# Patient Record
Sex: Male | Born: 1985 | Race: Black or African American | Hispanic: No | Marital: Single
Health system: Southern US, Community
[De-identification: ages and names within clinical notes are randomized; demographics above are authoritative.]

---

## 2005-03-07 ENCOUNTER — Emergency Department (HOSPITAL_COMMUNITY): Admission: EM | Admit: 2005-03-07 | Discharge: 2005-03-07 | Payer: Self-pay | Admitting: Family Medicine

## 2007-01-01 ENCOUNTER — Emergency Department (HOSPITAL_COMMUNITY): Admission: EM | Admit: 2007-01-01 | Discharge: 2007-01-01 | Payer: Self-pay | Admitting: Family Medicine

## 2007-01-28 ENCOUNTER — Emergency Department (HOSPITAL_COMMUNITY): Admission: EM | Admit: 2007-01-28 | Discharge: 2007-01-28 | Payer: Self-pay | Admitting: Family Medicine

## 2007-02-23 ENCOUNTER — Emergency Department (HOSPITAL_COMMUNITY): Admission: EM | Admit: 2007-02-23 | Discharge: 2007-02-23 | Payer: Self-pay | Admitting: Emergency Medicine

## 2008-11-27 IMAGING — CR DG CERVICAL SPINE COMPLETE 4+V
5 series · 5 of 5 positions shown · non-contrast
Comparison: none

CLINICAL DATA: 21-year-old, neck pain.  
 CERVICAL SPINE ? 5 VIEW:

[w c-spine lat]
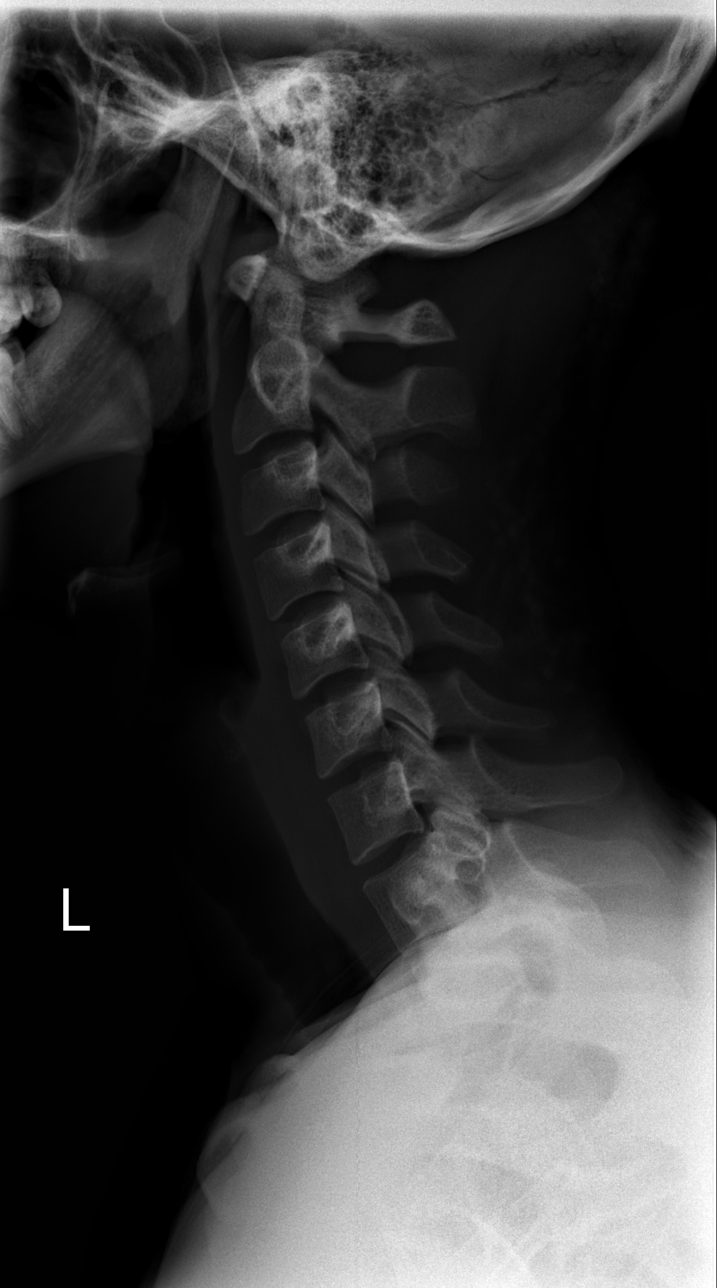

[w c-spine oblique (1 of 2)]
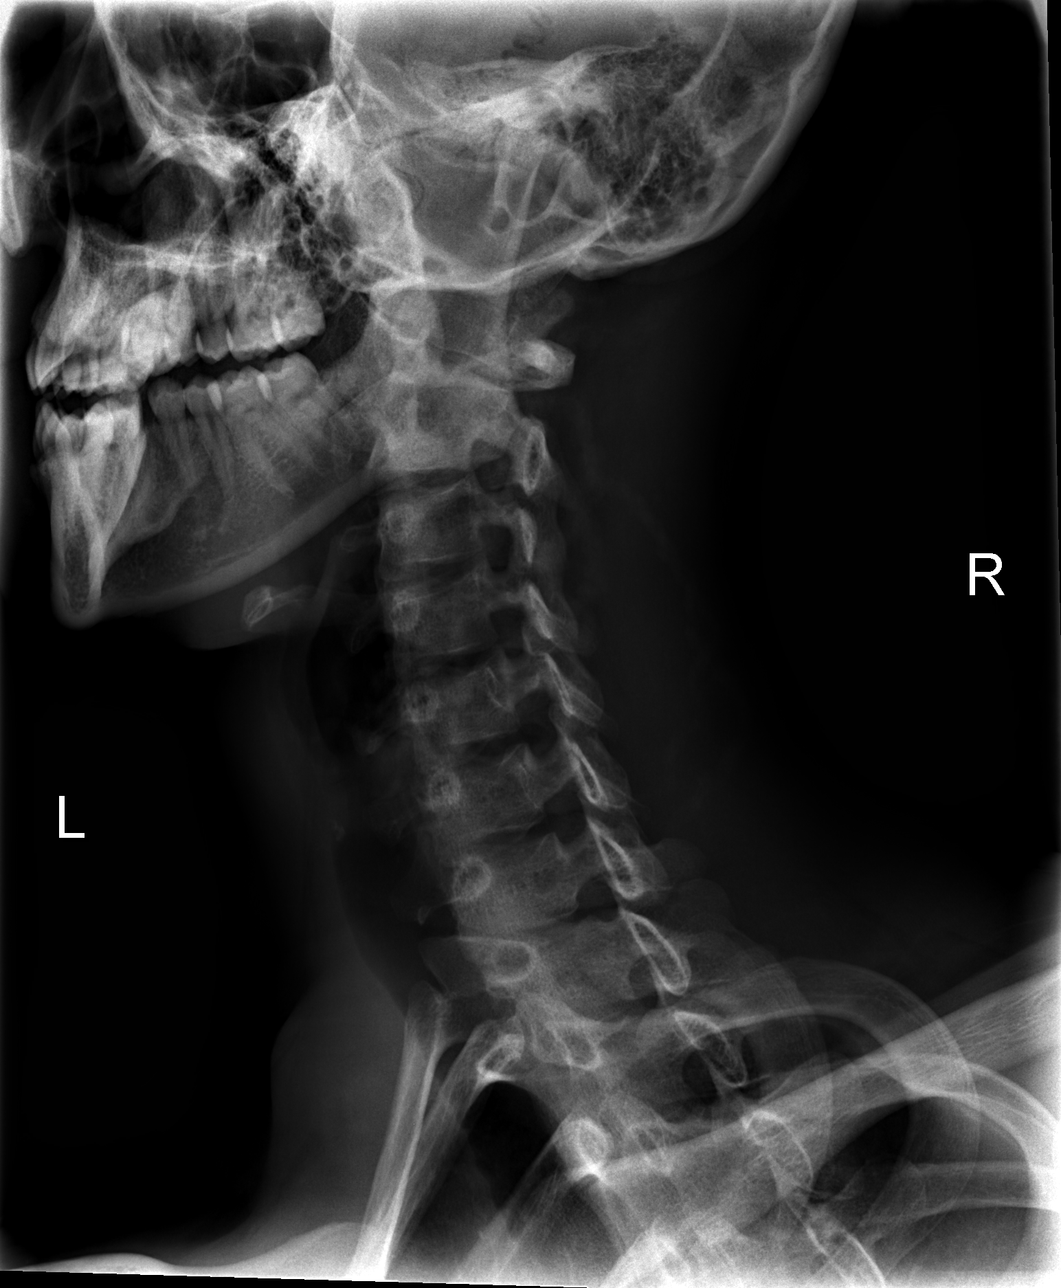

[w c-spine oblique (2 of 2)]
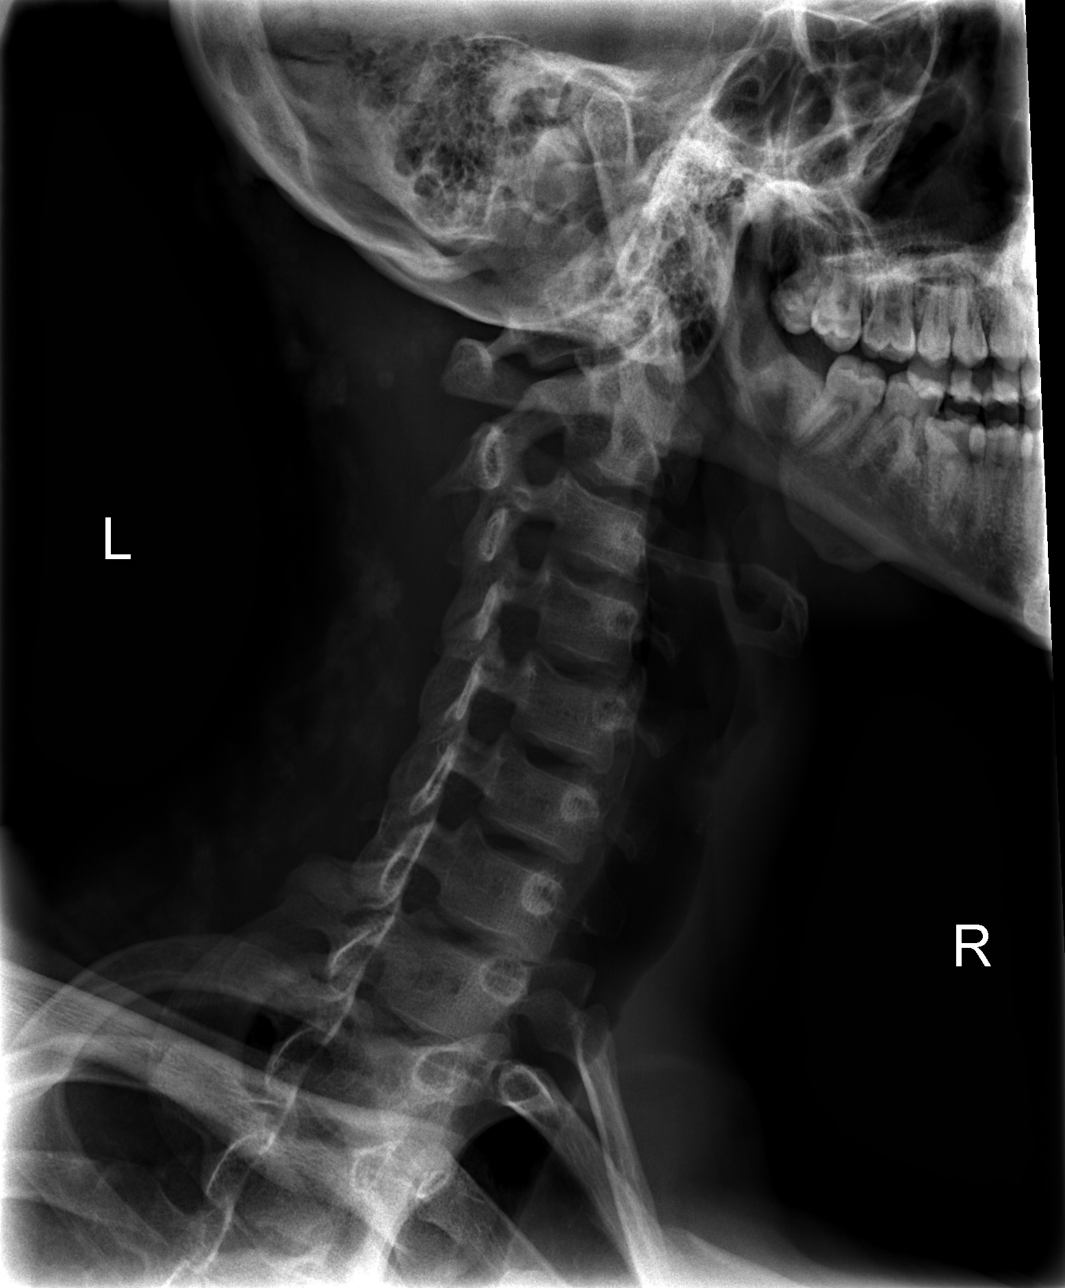

[w c-spine a.p.]
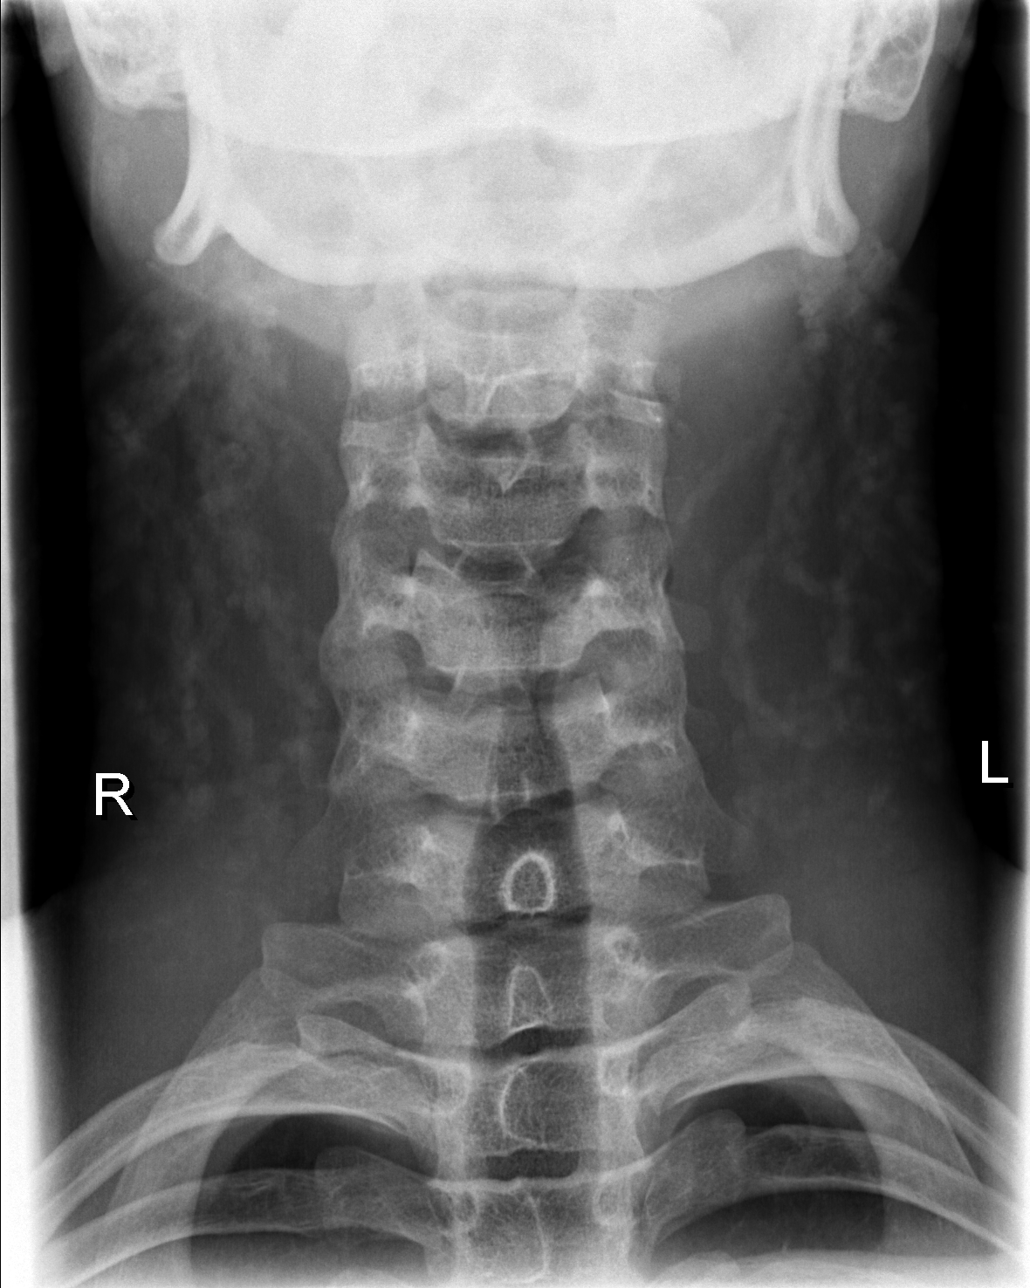

[w c-spine odontoid]
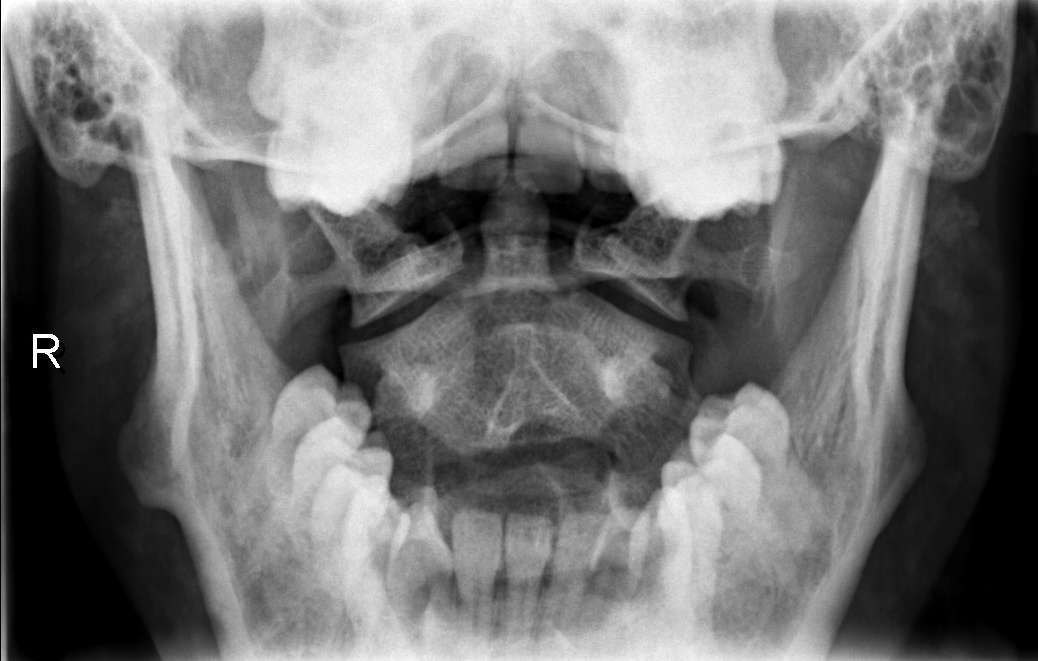

[5 of 5 positions shown; findings below may reference images not displayed]

FINDINGS: There is no evidence of cervical spine fracture or prevertebral soft tissue swelling.  Alignment is normal.  No other significant bone abnormalities are identified.
IMPRESSION: Negative cervical spine radiographs.

## 2008-11-27 IMAGING — CT CT HEAD W/O CM
1 series · 16 of 30 positions shown, 20 images · IV contrast (agent unspecified)
Comparison: none

CLINICAL DATA: Dizziness, headache.
 HEAD CT WITHOUT CONTRAST:
TECHNIQUE: Contiguous axial images were obtained from the base of the skull through the vertex according to standard protocol without contrast.

[Series 2: brain · axial · 0.49mm/px · z∈[+124,+266]mm · 16 of 32 slices shown, 20 images]
[im 2/32  brain]
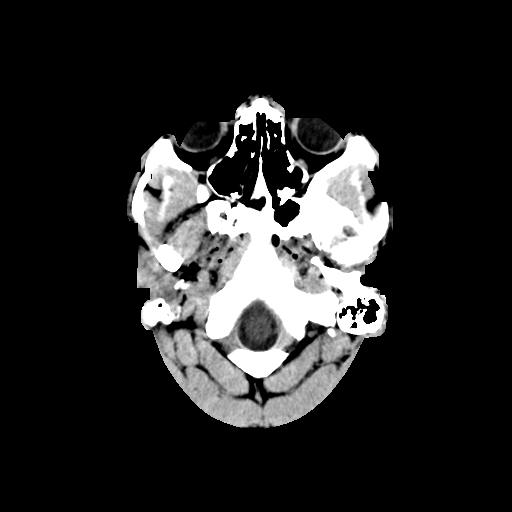
[im 2/32  bone]
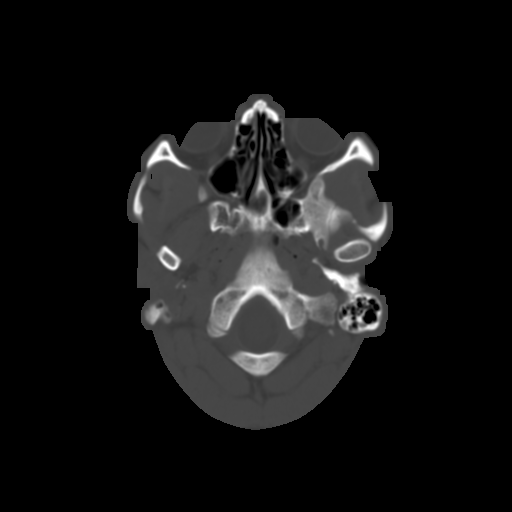
[im 4/32  brain]
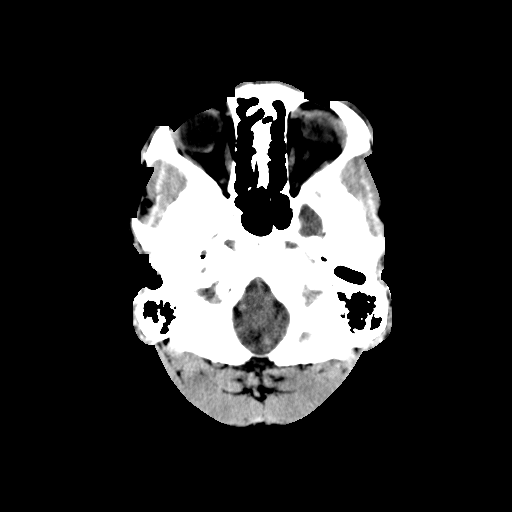
[im 6/32  brain]
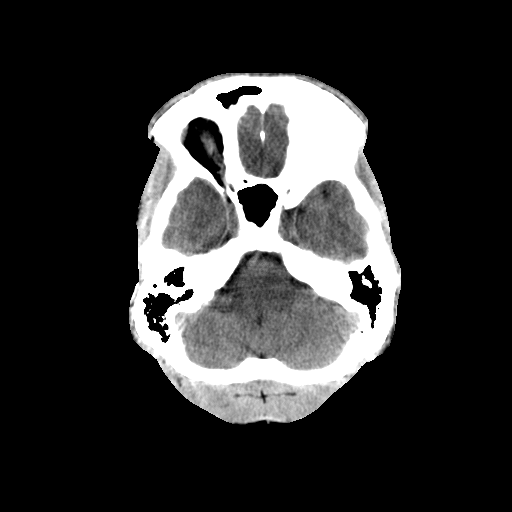
[im 8/32  brain]
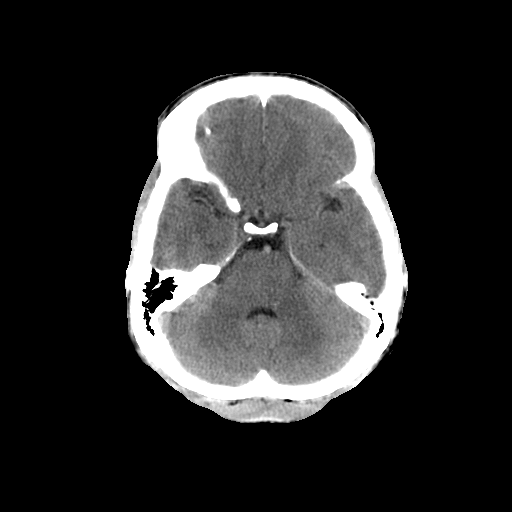
[im 9/32  brain]
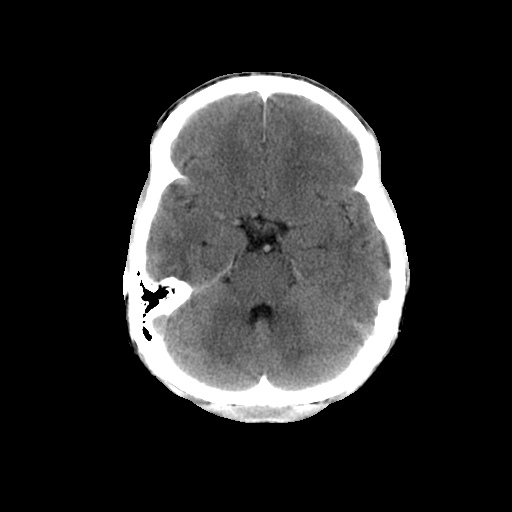
[im 9/32  bone]
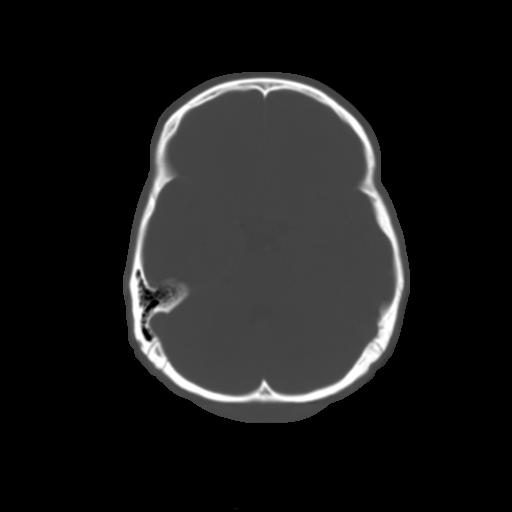
[im 11/32  brain]
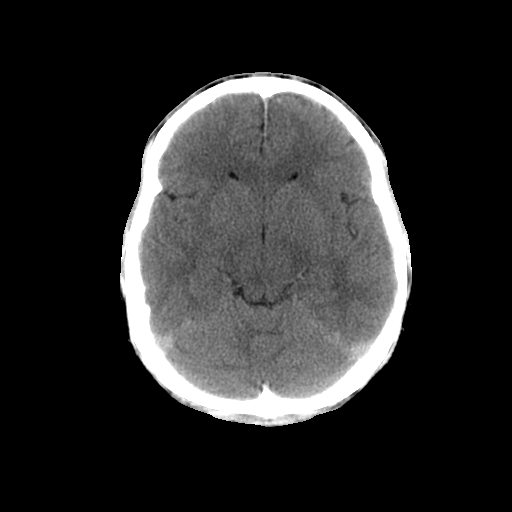
[im 13/32  brain]
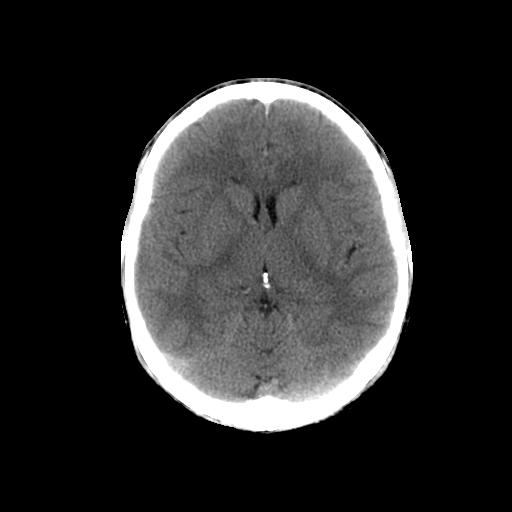
[im 15/32  brain]
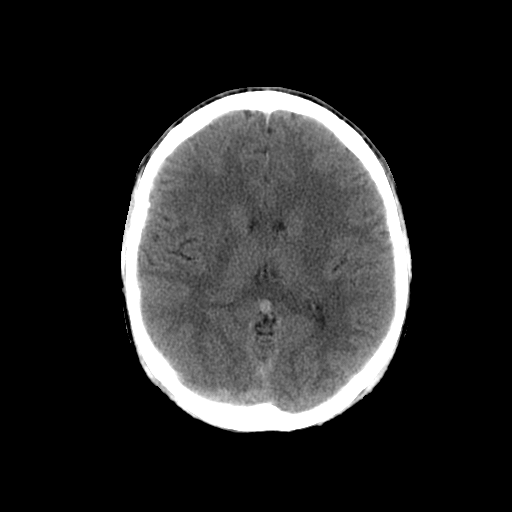
[im 17/32  brain]
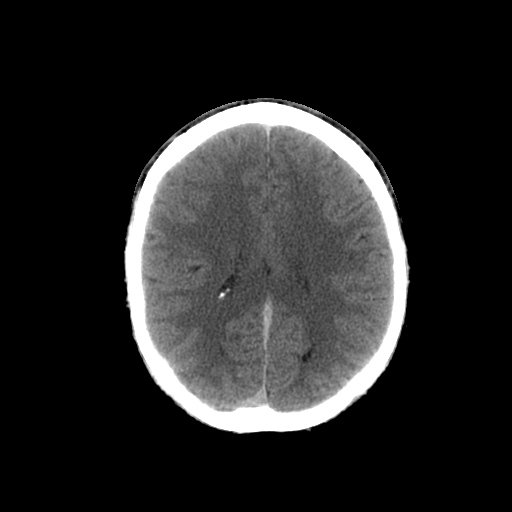
[im 17/32  bone]
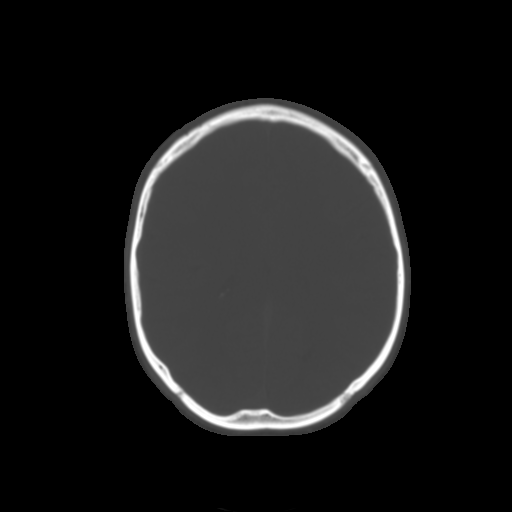
[im 19/32  brain]
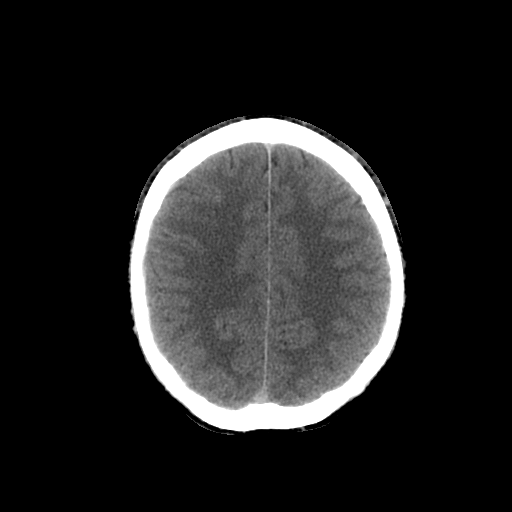
[im 21/32  brain]
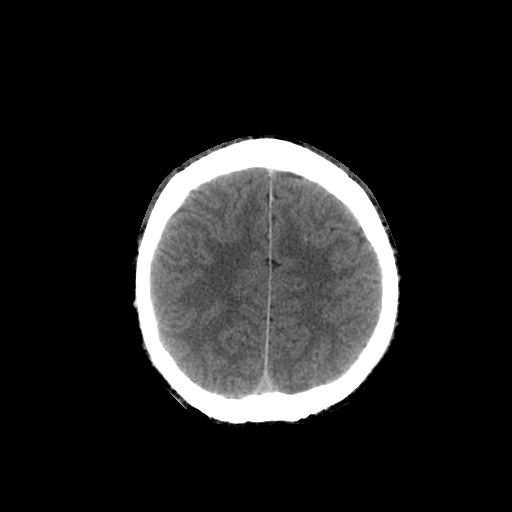
[im 23/32  brain]
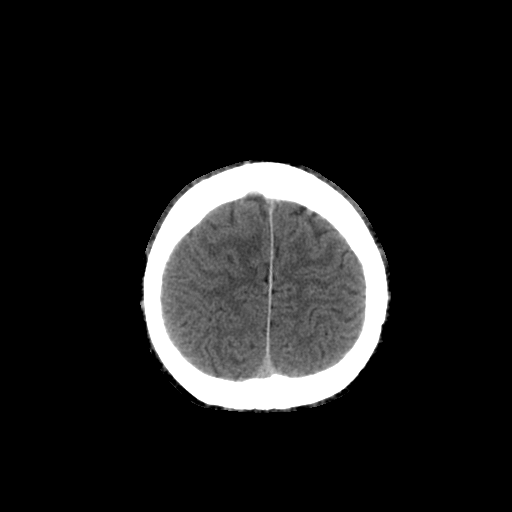
[im 24/32  brain]
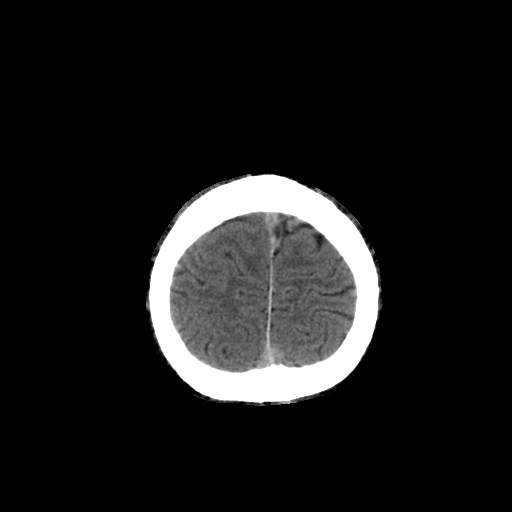
[im 24/32  bone]
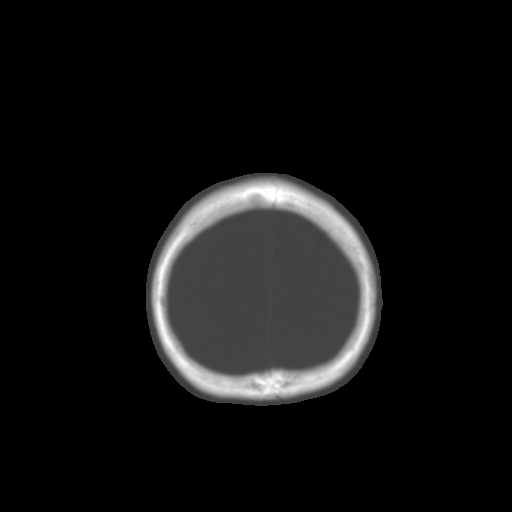
[im 26/32  brain]
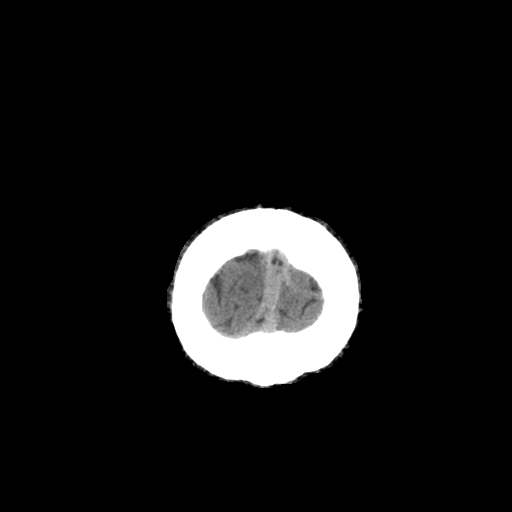
[im 28/32  brain]
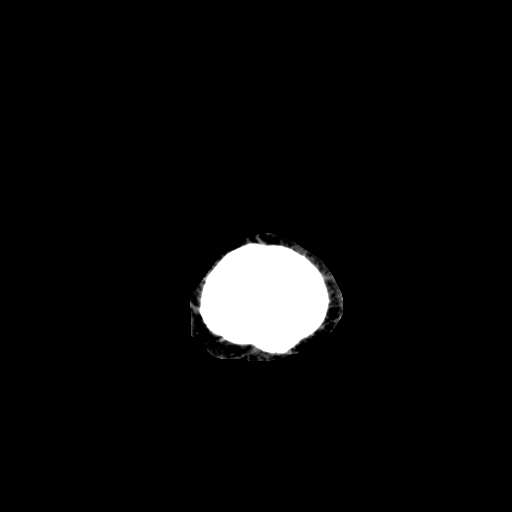
[im 30/32  brain]
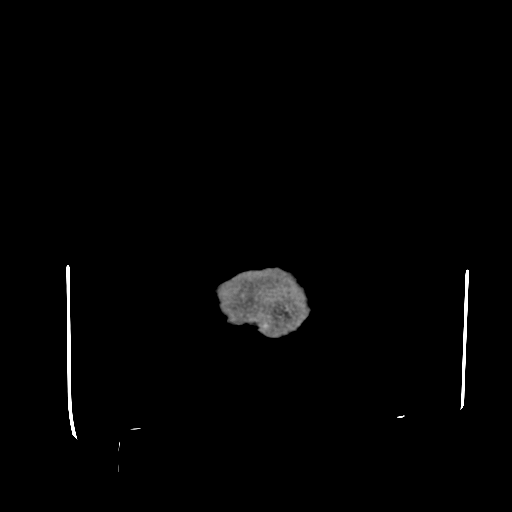

[16 of 30 positions shown; findings below may reference images not displayed]

FINDINGS: There is no evidence of acute intracranial abnormality, including mass or mass effect, hydrocephalus, extra-axial fluid collection, midline shift, hemorrhage, or infarct.  Visualized bony calvarium and paranasal sinuses are unremarkable.
IMPRESSION: No evidence of acute intracranial abnormality.

## 2015-09-26 ENCOUNTER — Encounter (HOSPITAL_COMMUNITY): Payer: Self-pay | Admitting: *Deleted

## 2015-09-26 ENCOUNTER — Emergency Department (HOSPITAL_COMMUNITY)
Admission: EM | Admit: 2015-09-26 | Discharge: 2015-09-26 | Disposition: A | Discharge status: Home / Self Care | Payer: Self-pay | Attending: Emergency Medicine | Admitting: Emergency Medicine

## 2015-09-26 DIAGNOSIS — L259 Unspecified contact dermatitis, unspecified cause: Secondary | ICD-10-CM | POA: Insufficient documentation

## 2015-09-26 DIAGNOSIS — F17200 Nicotine dependence, unspecified, uncomplicated: Secondary | ICD-10-CM | POA: Insufficient documentation

## 2015-09-26 MED ORDER — FAMOTIDINE 20 MG PO TABS
20.0000 mg | ORAL_TABLET | Freq: Once | ORAL | Status: AC
Start: 2015-09-26 — End: 2015-09-26
  Administered 2015-09-26: 20 mg via ORAL
  Filled 2015-09-26: qty 1

## 2015-09-26 MED ORDER — HYDROXYZINE HCL 25 MG PO TABS
25.0000 mg | ORAL_TABLET | Freq: Once | ORAL | Status: AC
  Administered 2015-09-26: 25 mg via ORAL
  Filled 2015-09-26: qty 1

## 2015-09-26 MED ORDER — PREDNISONE 10 MG PO TABS: 20.0000 mg | ORAL_TABLET | Freq: Two times a day (BID) | ORAL | Status: AC

## 2015-09-26 MED ORDER — HYDROXYZINE HCL 25 MG PO TABS: 25.0000 mg | ORAL_TABLET | Freq: Four times a day (QID) | ORAL | Status: AC

## 2015-09-26 MED ORDER — PREDNISONE 20 MG PO TABS
40.0000 mg | ORAL_TABLET | Freq: Once | ORAL | Status: AC
Start: 2015-09-26 — End: 2015-09-26
  Administered 2015-09-26: 40 mg via ORAL
  Filled 2015-09-26: qty 2

## 2015-09-26 NOTE — ED Provider Notes (Signed)
History  By signing my name below, I, Earmon PhoenixJennifer Waddell, attest that this documentation has been prepared under the direction and in the presence of [facility] Neese, [place]. Electronically Signed: Earmon PhoenixJennifer Waddell, ED Scribe. 09/26/2015. 7:35 PM.  Chief Complaint  Patient presents with  . Rash  . Poison Lajoyce [place]ersvy   The history is provided by the patient and medical records. No language interpreter was used.    HPI Comments:  Delorise Jacksonlfred Royse is a 30 y.o. male brought in by EMS who presents to the Emergency Department complaining of a worsening rash located to bilateral arms, chest and back that began 2 days ago. Pt reports he was drinking beer, became intoxicated and was laid in a bed of poison ivy. He has not taken anything to treat his symptoms but was given Benadryl 50 mg via EMS. He denies modifying factors. He denies fever, chills, difficulty breathing or swallowing.  History reviewed. No pertinent past medical history. History reviewed. No pertinent past surgical history. No family history on file. Social History  Substance Use Topics  . Smoking status: Current Every Day Smoker  . Smokeless tobacco: Never Used  . Alcohol Use: Yes     Comment: ocassionally    Review of Systems  Constitutional: Negative for fever and chills.  HENT: Negative for trouble swallowing.   Respiratory: Negative for shortness of breath.   Skin: Positive for rash.  All other systems reviewed and are negative.   Allergies  Review of patient's allergies indicates no known allergies.  Home Medications   Prior to Admission medications   Medication Sig Start Date End Date Taking? Authorizing Provider  hydrOXYzine (ATARAX/VISTARIL) 25 MG tablet Take 1 tablet (25 mg total) by mouth every 6 (six) hours. 09/26/15   Hope Orlene OchM Neese, NP  predniSONE (DELTASONE) 10 MG tablet Take 2 tablets (20 mg total) by mouth 2 (two) times daily with a meal. 09/26/15   Hope Orlene OchM Neese, NP   Triage Vitals: BP 122/75 mmHg  Pulse 60  Temp(Src)  98.7 F (37.1 C) (Oral)  Resp 18  Ht 5\' 10"  (1.778 m)  Wt 135 lb (61.236 kg)  BMI 19.37 kg/m2  SpO2 98% Physical Exam  Constitutional: He is oriented to person, place, and time. He appears well-developed and well-nourished.  HENT:  Head: Normocephalic.  Mouth/Throat: Uvula is midline, oropharynx is clear and moist and mucous membranes are normal. No oropharyngeal exudate, posterior oropharyngeal edema or posterior oropharyngeal erythema.  Eyes: Conjunctivae and EOM are normal.  Neck: Neck supple.  Cardiovascular: Normal rate and regular rhythm.   Pulmonary/Chest: Effort normal and breath sounds normal. No respiratory distress. He has no wheezes.  Abdominal: Soft. There is no tenderness.  Musculoskeletal: Normal range of motion.  Neurological: He is alert and oriented to person, place, and time. No cranial nerve deficit.  Skin: Skin is warm and dry. Rash noted.  Linear, raised vesicular areas to left lower back, left elbow and forearm and right forearm and right shoulder.  Psychiatric: He has a normal mood and affect. His behavior is normal.  Nursing note and vitals reviewed.   ED Course  Procedures (including critical care time) DIAGNOSTIC STUDIES: Oxygen Saturation is 98% on RA, normal by my interpretation.   COORDINATION OF CARE: 7:25 PM- Will order Atarax and Prednisone prior to discharge. Will also give prescriptions for same. Pt verbalizes understanding and agrees to plan.  Medications  hydrOXYzine (ATARAX/VISTARIL) tablet 25 mg (25 mg Oral Given 09/26/15 1940)  predniSONE (DELTASONE) tablet 40 mg (40 mg  Oral Given 09/26/15 1940)  famotidine (PEPCID) tablet 20 mg (20 mg Oral Given 09/26/15 1940)     MDM   Final diagnoses:  Contact dermatitis    Patient with contact dermatitis. Instructed to avoid offending agent.  Will treat with Atarax and Prednisone.  No signs of secondary infection. Follow up with PCP in 2-3 days. Return precautions discussed. Pt is safe for discharge  at this time without respiratory symptoms and 02 SAT 100% on R/A and no swelling of the throat.   Rx Atarax and prednisone.  I personally performed the services described in this documentation, which was scribed in my presence. The recorded information has been reviewed and is accurate.     [address]Hope MarblemountM Neese, [place] 09/27/15 [id number]  Marily MemosJason Mesner, MD 09/28/15 1640

## 2015-09-26 NOTE — ED Notes (Signed)
Patient presents stating he got into some poison on Friday and now it is spreading to his arms, chest, lower back

## 2015-09-26 NOTE — ED Notes (Signed)
Patient verbalized understanding of discharge instructions and denies any further needs or questions at this time. VS stable. Patient ambulatory with steady gait, provided patient with bus pass.

## 2015-09-26 NOTE — ED Notes (Signed)
Benadryl 50mg  PO given by EMS  VSS

## 2015-09-28 ENCOUNTER — Encounter (HOSPITAL_COMMUNITY): Payer: Self-pay

## 2015-09-28 ENCOUNTER — Emergency Department (HOSPITAL_COMMUNITY)
Admission: EM | Admit: 2015-09-28 | Discharge: 2015-09-28 | Disposition: A | Discharge status: Home / Self Care | Payer: Self-pay | Attending: Emergency Medicine | Admitting: Emergency Medicine

## 2015-09-28 DIAGNOSIS — L237 Allergic contact dermatitis due to plants, except food: Secondary | ICD-10-CM | POA: Insufficient documentation

## 2015-09-28 DIAGNOSIS — F17200 Nicotine dependence, unspecified, uncomplicated: Secondary | ICD-10-CM | POA: Insufficient documentation

## 2015-09-28 MED ORDER — DIPHENHYDRAMINE HCL 25 MG PO CAPS
25.0000 mg | ORAL_CAPSULE | Freq: Once | ORAL | Status: AC
  Administered 2015-09-28: 25 mg via ORAL
  Filled 2015-09-28: qty 1

## 2015-09-28 MED ORDER — PREDNISONE 20 MG PO TABS: ORAL_TABLET | ORAL | Status: AC

## 2015-09-28 MED ORDER — PREDNISONE 20 MG PO TABS
60.0000 mg | ORAL_TABLET | Freq: Once | ORAL | Status: AC
  Administered 2015-09-28: 60 mg via ORAL
  Filled 2015-09-28: qty 3

## 2015-09-28 MED ORDER — DIPHENHYDRAMINE HCL 25 MG PO TABS: 25.0000 mg | ORAL_TABLET | Freq: Four times a day (QID) | ORAL | Status: AC | PRN

## 2015-09-28 NOTE — ED Provider Notes (Signed)
CSN: [id number]     Arrival date & time 09/28/15  [id number] History   First MD Initiated Contact with Patient 09/28/15 [phone]     Chief Complaint  Patient presents with  . Rash     (Consider location/radiation/quality/duration/timing/severity/associated sxs/prior Treatment) Patient is a 30 y.o. male presenting with rash. The history is provided by the patient.  Rash Associated symptoms: no diarrhea, no fever, no headaches, no shortness of breath, no sore throat and not vomiting   Patient indicates last Friday, was drinking a few beers and friends laid him in/near a bed of poison ivy. Notes pruritic rash started the next day. Symptoms constant, persistent, worsening since, and has noted on some new areas. Now staying at shelter, states has no meds, took no meds prior to coming today. No recent new meds/med use. No change in diet/foods. No change in home or personal products. No fever or chills. Does not feel sick or ill.       History reviewed. No pertinent past medical history. No past surgical history on file. No family history on file. Social History  Substance Use Topics  . Smoking status: Current Every Day Smoker  . Smokeless tobacco: Never Used  . Alcohol Use: Yes     Comment: ocassionally    Review of Systems  Constitutional: Negative for fever and chills.  HENT: Negative for sore throat.   Eyes: Negative for redness.  Respiratory: Negative for cough and shortness of breath.   Gastrointestinal: Negative for vomiting and diarrhea.  Skin: Positive for rash.  Neurological: Negative for headaches.      Allergies  Review of patient's allergies indicates no known allergies.  Home Medications   Prior to Admission medications   Medication Sig Start Date End Date Taking? Authorizing Provider  hydrOXYzine (ATARAX/VISTARIL) 25 MG tablet Take 1 tablet (25 mg total) by mouth every 6 (six) hours. 09/26/15   Hope Orlene OchM Neese, NP  predniSONE (DELTASONE) 10 MG tablet Take 2 tablets (20 mg  total) by mouth 2 (two) times daily with a meal. 09/26/15   Hope Orlene OchM Neese, NP   BP 108/76 mmHg  Pulse 56  Temp(Src) 98.4 F (36.9 C) (Oral)  Resp 16  SpO2 100% Physical Exam  Constitutional: He appears well-developed and well-nourished. No distress.  HENT:  Head: Atraumatic.  Mouth/Throat: Oropharynx is clear and moist.  Eyes: Conjunctivae are normal. No scleral icterus.  Neck: Neck supple. No tracheal deviation present.  Cardiovascular: Normal rate, regular rhythm, normal heart sounds and intact distal pulses.   Pulmonary/Chest: Effort normal and breath sounds normal. No accessory muscle usage. No respiratory distress.  Abdominal: Soft. He exhibits no distension. There is no tenderness.  Musculoskeletal: Normal range of motion. He exhibits no edema.  Neurological: He is alert.  Skin: Skin is warm and dry. He is not diaphoretic.  Rash, pruritic, w several linear areas felt c/w poison ivy on upper extremities, lower back/trunk. No cellulitis/infection. No mm lesions. No palms or soles.   Psychiatric: He has a normal mood and affect.  Nursing note and vitals reviewed.   ED Course  Procedures (including critical care time)   MDM   Reviewed nursing notes and prior charts for additional history.   No meds pta. Confirmed nkda.  pred po. Benadryl po.    rx for home.      Cathren LaineKevin Kazi Reppond, MD 09/28/15 [phone]

## 2015-09-28 NOTE — ED Notes (Signed)
Bed: NW29WA05 Expected date:  Expected time:  Means of arrival:  Comments: EMS- 30yo M, BUE poison ivy

## 2015-09-28 NOTE — Discharge Instructions (Signed)
It was our pleasure to provide your ER care today - we hope that you feel better.  Take prednisone as prescribed.  Take benadryl as need for itching - may make drowsy, no driving when taking.   Follow up with primary care doctor in 1 week if symptoms fail to improve/resolve.  Return to ER if worse, new symptoms, fevers, other concern.    Poison [employer] Poison ivy is a inflammation of the skin (contact dermatitis) caused by touching the allergens on the leaves of the ivy plant following previous exposure to the plant. The rash usually appears 48 hours after exposure. The rash is usually bumps (papules) or blisters (vesicles) in a linear pattern. Depending on your own sensitivity, the rash may simply cause redness and itching, or it may also progress to blisters which may break open. These must be well cared for to prevent secondary bacterial (germ) infection, followed by scarring. Keep any open areas dry, clean, dressed, and covered with an antibacterial ointment if needed. The eyes may also get puffy. The puffiness is worst in the morning and gets better as the day progresses. This dermatitis usually heals without scarring, within 2 to 3 weeks without treatment. HOME CARE INSTRUCTIONS  Thoroughly wash with soap and water as soon as you have been exposed to poison ivy. You have about one half hour to remove the plant resin before it will cause the rash. This washing will destroy the oil or antigen on the skin that is causing, or will cause, the rash. Be sure to wash under your fingernails as any plant resin there will continue to spread the rash. Do not rub skin vigorously when washing affected area. Poison ivy cannot spread if no oil from the plant remains on your body. A rash that has progressed to weeping sores will not spread the rash unless you have not washed thoroughly. It is also important to wash any clothes you have been wearing as these may carry active allergens. The rash will return if you wear  the unwashed clothing, even several days later. Avoidance of the plant in the future is the best measure. Poison ivy plant can be recognized by the number of leaves. Generally, poison ivy has three leaves with flowering branches on a single stem. Diphenhydramine may be purchased over the counter and used as needed for itching. Do not drive with this medication if it makes you drowsy.Ask your caregiver about medication for children. SEEK MEDICAL CARE IF:  Open sores develop.  Redness spreads beyond area of rash.  You notice purulent (pus-like) discharge.  You have increased pain.  Other signs of infection develop (such as fever).   This information is not intended to replace advice given to you by your health care provider. Make sure you discuss any questions you have with your health care provider.   Document Released: 03/09/2000 Document Revised: 06/04/2011 Document Reviewed: 08/18/2014 Elsevier Interactive Patient Education [employer]2016 Elsevier Inc.

## 2015-09-28 NOTE — ED Notes (Signed)
[facility] and was dx with poison ivy.  He is in no distress.  He has red pruritic rash mainly at forearms.

## 2015-10-19 ENCOUNTER — Emergency Department (HOSPITAL_COMMUNITY)
Admission: EM | Admit: 2015-10-19 | Discharge: 2015-10-19 | Disposition: A | Discharge status: Home / Self Care | Payer: Self-pay | Attending: Emergency Medicine | Admitting: Emergency Medicine

## 2015-10-19 ENCOUNTER — Encounter (HOSPITAL_COMMUNITY): Payer: Self-pay

## 2015-10-19 DIAGNOSIS — F17200 Nicotine dependence, unspecified, uncomplicated: Secondary | ICD-10-CM | POA: Insufficient documentation

## 2015-10-19 DIAGNOSIS — L237 Allergic contact dermatitis due to plants, except food: Secondary | ICD-10-CM | POA: Insufficient documentation

## 2015-10-19 NOTE — ED Provider Notes (Signed)
[facility]-EMERGENCY DEPT Provider Note   CSN: [id number] Arrival date & time: 10/19/15  0904  First Provider Contact:  None    By signing my name below, I, Troy Yang, attest that this documentation has been prepared under the direction and in the presence of Troy Forts, PA-C Electronically Signed: Soijett Yang, ED Scribe. 10/19/15. 11:00 AM.    History   Chief Complaint No chief complaint on file.   HPI Troy Yang is a 30 y.o. male who presents to the Emergency Department via EMS complaining of rash onset 2 weeks. Pt reports that he was intoxicated when his friends placed him into a patch of poison ivy. Pt states that he has been seen twice for his symptoms and treated both times with prednisone. Pt reports that his symptoms have resolved at this time. Pt denies new soaps/medications/pets/lotion/detergent. He notes that he has tried Rx medications with mild relief of his symptoms. He denies wound, color change, swelling, fever, chills, and any other symptoms.   Per pt chart review: Pt was seen in the ED on 09/28/2015 for rash. Pt was Rx benadryl and prednisone for their symptoms. Pt was also seen in the ED on 09/26/2015 for a rash and was Rx atarax and prednisone.    The history is provided by the patient. No language interpreter was used.    History reviewed. No pertinent past medical history.  There are no active problems to display for this patient.   History reviewed. No pertinent surgical history.   benda  Home Medications    Prior to Admission medications   Medication Sig Start Date End Date Taking? Authorizing Provider  diphenhydrAMINE (BENADRYL) 25 MG tablet Take 1 tablet (25 mg total) by mouth every 6 (six) hours as needed. 09/28/15   Troy Laine, MD  hydrOXYzine (ATARAX/VISTARIL) 25 MG tablet Take 1 tablet (25 mg total) by mouth every 6 (six) hours. 09/26/15   Troy Orlene Och, NP  predniSONE (DELTASONE) 10 MG tablet Take 2 tablets (20 mg total) by mouth 2 (two) times  daily with a meal. 09/26/15   Troy Orlene Och, NP  predniSONE (DELTASONE) 20 MG tablet 3 po once a day for 2 days, then 2 po once a day for 3 days, then 1 po once a day for 3 days 09/29/15   Troy Laine, MD    Family History No family history on file.  Social History Social History  Substance Use Topics  . Smoking status: Current Every Day Smoker  . Smokeless tobacco: Never Used  . Alcohol use Yes     Comment: ocassionally     Allergies   Review of patient's allergies indicates no known allergies.   Review of Systems Review of Systems  Constitutional: Negative for chills and fever.  Musculoskeletal: Negative for joint swelling.  Skin: Positive for rash. Negative for color change and wound.     Physical Exam Updated Vital Signs BP 120/83 (BP Location: Right Arm)   Pulse 67   Temp 98.1 F (36.7 C) (Oral)   Resp 16   Ht 5\' 9"  (1.753 m)   Wt 61.2 kg   SpO2 97%   BMI 19.94 kg/m   Physical Exam  Constitutional: He is oriented to person, place, and time. He appears well-developed and well-nourished. No distress.  HENT:  Head: Normocephalic and atraumatic.  Eyes: EOM are normal.  Neck: Neck supple.  Cardiovascular: Normal rate.   Pulmonary/Chest: Effort normal. No respiratory distress.  Abdominal: He exhibits no distension.  Musculoskeletal: Normal  range of motion.  Neurological: He is alert and oriented to person, place, and time.  Skin: Skin is warm and dry. No rash noted.  Skin with no rash or any abnormalities.   Psychiatric: He has a normal mood and affect. His behavior is normal.  Nursing note and vitals reviewed.    ED Treatments / Results  DIAGNOSTIC STUDIES: Oxygen Saturation is 97% on RA, nl by my interpretation.    COORDINATION OF CARE: 10:56 AM Discussed treatment plan with pt at bedside and pt agreed to plan.    Procedures Procedures (including critical care time)  Medications Ordered in ED Medications - No data to display   Initial  Impression / Assessment and Plan / ED Course  I have reviewed the triage vital signs and the nursing notes.   Clinical Course    Final Clinical Impressions(s) / ED Diagnoses   Final diagnoses:  Poison ivy   Labs:   Imaging:   Consults:   Therapeutics:   Discharge Meds:   Assessment/Plan: Pt here for recheck of previous poison ivy. No sign of poison ivy or any other abnormality. The patient appears reasonably screened and/or stabilized for discharge and I doubt any other emergent medical condition requiring further screening, evaluation, or treatment in the ED prior to discharge.   New Prescriptions Discharge Medication List as of 10/19/2015 10:55 AM      I personally performed the services described in this documentation, which was scribed in my presence. The recorded information has been reviewed and is accurate.Dictation #1 [id number]  [id number]     Troy Mechanic, PA-C 10/19/15 1451  Medical screening examination/treatment/procedure(s) were performed by non-physician practitioner and as supervising physician I was immediately available for consultation/collaboration.   EKG Interpretation None         Troy Pander, MD 11/24/15 1410

## 2015-10-19 NOTE — ED Notes (Signed)
Was seen first of July for poison ivy given meds and he states he finished , niow wnts a check up. Had one small p[lace crop up and he itched 2 days ago but no  New spots now

## 2015-10-19 NOTE — ED Triage Notes (Signed)
Patient arrived by [facility] requesting recheck of poison ivy. Has been seen x 2 for same.  No rash noted to arms. NAD

## 2016-01-10 ENCOUNTER — Encounter (HOSPITAL_COMMUNITY): Payer: Self-pay | Admitting: Emergency Medicine

## 2016-01-10 ENCOUNTER — Emergency Department (HOSPITAL_COMMUNITY)
Admission: EM | Admit: 2016-01-10 | Discharge: 2016-01-10 | Disposition: A | Discharge status: Home / Self Care | Payer: Self-pay | Attending: Emergency Medicine | Admitting: Emergency Medicine

## 2016-01-10 DIAGNOSIS — Y999 Unspecified external cause status: Secondary | ICD-10-CM | POA: Insufficient documentation

## 2016-01-10 DIAGNOSIS — S0342XA Sprain of jaw, left side, initial encounter: Secondary | ICD-10-CM | POA: Insufficient documentation

## 2016-01-10 DIAGNOSIS — Y929 Unspecified place or not applicable: Secondary | ICD-10-CM | POA: Insufficient documentation

## 2016-01-10 DIAGNOSIS — X58XXXA Exposure to other specified factors, initial encounter: Secondary | ICD-10-CM | POA: Insufficient documentation

## 2016-01-10 DIAGNOSIS — S0340XA Sprain of jaw, unspecified side, initial encounter: Secondary | ICD-10-CM

## 2016-01-10 DIAGNOSIS — Y939 Activity, unspecified: Secondary | ICD-10-CM | POA: Insufficient documentation

## 2016-01-10 MED ORDER — IBUPROFEN 800 MG PO TABS: 800.0000 mg | ORAL_TABLET | Freq: Three times a day (TID) | ORAL | 0 refills | Status: DC

## 2016-01-10 NOTE — ED Triage Notes (Addendum)
C/O LEFT LOWER FACIAL PAIN X 1 WEEK. TO ED VIA [facility]. REQUESTING BUS PASS TO GET HOME.

## 2016-01-10 NOTE — Discharge Instructions (Signed)
Please read and follow all provided instructions.  Your diagnoses today include:  1. TMJ (sprain of temporomandibular joint), initial encounter    Tests performed today include: Vital signs. See below for your results today.   Medications prescribed:  Take as prescribed   Home care instructions:  Follow any educational materials contained in this packet.  Follow-up instructions: Please follow-up with a Dentist for further evaluation of symptoms and treatment\  Please Contact this number: [phone] to get into contact with an emergency dental service to schedule an appointment with a local dentist     Return instructions:  Please return to the Emergency Department if you do not get better, if you get worse, or new symptoms OR  - Fever (temperature greater than 101.46F)  - Bleeding that does not stop with holding pressure to the area    -Severe pain (please note that you may be more sore the day after your accident)  - Chest Pain  - Difficulty breathing  - Severe nausea or vomiting  - Inability to tolerate food and liquids  - Passing out  - Skin becoming red around your wounds  - Change in mental status (confusion or lethargy)  - New numbness or weakness    Please return if you have any other emergent concerns.  Additional Information:  Your vital signs today were: BP 122/71    Pulse 82    Temp 98.3 F (36.8 C) (Oral)    Resp 18    SpO2 99%  If your blood pressure (BP) was elevated above 135/85 this visit, please have this repeated by your doctor within one month. ---------------

## 2016-01-10 NOTE — ED Provider Notes (Signed)
[facility]-EMERGENCY DEPT Provider Note   CSN: [id number] Arrival date & time: 01/10/16  1233  By signing my name below, I, Troy Yang, attest that this documentation has been prepared under the direction and in the presence of Troy Piliyler Jessicca Stitzer, PA-C. Electronically Signed: Sonum Yang, Neurosurgeoncribe. 01/10/16. 12:57 PM.  History   Chief Complaint Chief Complaint  Patient presents with  . Facial Pain    The history is provided by the patient. No language interpreter was used.     HPI Comments: Troy Yang is a 30 y.o. male who presents to the Emergency Department via EMS complaining of 1 week of intermittent left jaw pain that occurs periodically throughout the day. He denies similar symptoms in the past. He states drinking cold liquids and applied warmth relieves his pain temporarily. He has taken Tylenol with some relief. He denies pain with chewing. He denies fever, difficulty swallowing. No other symptoms noted.    History reviewed. No pertinent past medical history.  There are no active problems to display for this patient.   History reviewed. No pertinent surgical history.     Home Medications    Prior to Admission medications   Medication Sig Start Date End Date Taking? Authorizing Provider  diphenhydrAMINE (BENADRYL) 25 MG tablet Take 1 tablet (25 mg total) by mouth every 6 (six) hours as needed. 09/28/15   Cathren LaineKevin Steinl, MD  hydrOXYzine (ATARAX/VISTARIL) 25 MG tablet Take 1 tablet (25 mg total) by mouth every 6 (six) hours. 09/26/15   Hope Orlene OchM Neese, NP  predniSONE (DELTASONE) 10 MG tablet Take 2 tablets (20 mg total) by mouth 2 (two) times daily with a meal. 09/26/15   Hope Orlene OchM Neese, NP  predniSONE (DELTASONE) 20 MG tablet 3 po once a day for 2 days, then 2 po once a day for 3 days, then 1 po once a day for 3 days 09/29/15   Cathren LaineKevin Steinl, MD    Family History History reviewed. No pertinent family history.  Social History Social History  Substance Use Topics  . Smoking status: Unknown  If Ever Smoked  . Smokeless tobacco: Never Used     Comment: PT DENIES THAT HE SMOKES.  Marland Kitchen. Alcohol use Yes     Comment: ocassionally     Allergies   Review of patient's allergies indicates no known allergies.   Review of Systems Review of Systems  Constitutional: Negative for fever.  HENT: Negative for dental problem and trouble swallowing.        +jaw pain     Physical Exam Updated Vital Signs BP 122/71   Pulse 82   Temp 98.3 F (36.8 C) (Oral)   Resp 18   SpO2 99%   Physical Exam  Constitutional: He is oriented to person, place, and time. Vital signs are normal. He appears well-developed and well-nourished. No distress.  No acute distress. Sitting comfortably.   HENT:  Head: Normocephalic and atraumatic.  Right Ear: Hearing normal.  Left Ear: Hearing normal.  Mouth/Throat: Uvula is midline and oropharynx is clear and moist. No trismus in the jaw. Normal dentition. No dental abscesses or dental caries.  Tenderness to palpation along left temporomandibular joint. No erythema or obvious swelling. Examination of oropharynx shows no abnormal dentition, no dental caries, no dental abscess, no uvula deviation, no trismus. Phonates well  Eyes: Conjunctivae and EOM are normal. Pupils are equal, round, and reactive to light.  Neck: Normal range of motion. Neck supple.  Cardiovascular: Normal rate and regular rhythm.   Pulmonary/Chest: Effort  normal.  Neurological: He is alert and oriented to person, place, and time.  Skin: Skin is warm and dry.  Psychiatric: He has a normal mood and affect. His speech is normal and behavior is normal. Thought content normal.  Nursing note and vitals reviewed.  ED Treatments / Results  DIAGNOSTIC STUDIES: Oxygen Saturation is 99% on RA, normal by my interpretation.    COORDINATION OF CARE: 12:56 PM Discussed treatment plan with pt at bedside and pt agreed to plan.   Labs (all labs ordered are listed, but only abnormal results are  displayed) Labs Reviewed - No data to display  EKG  EKG Interpretation None      Radiology No results found.  Procedures Procedures (including critical care time)  Medications Ordered in ED Medications - No data to display  Initial Impression / Assessment and Plan / ED Course  I have reviewed the triage vital signs and the nursing notes.  Pertinent labs & imaging results that were available during my care of the patient were reviewed by me and considered in my medical decision making (see chart for details).  Clinical Course   Final Clinical Impressions(s) / ED Diagnoses     I have reviewed the relevant previous healthcare records.  I obtained HPI from historian.   ED Course:  Assessment: Pt is a 30yM who presents with left jaw pain x 1 week. On exam, pt in NAD. Nontoxic/nonseptic appearing. VSS. Afebrile. No dental abscess seen. No dental caries/abnormalities. No swelling. Full ROM of neck without pain. Able to tolerate PO. TTP along temporo mandibular joint  on left side. Likely the cause. The patient appears reasonably screened and/or stabilized for discharge and I doubt any other emergent medical condition requiring further screening, evaluation, or treatment in the ED prior to discharge. Plan is to DC Home with Rx Ibuprofen. At time of discharge, Patient is in no acute distress. Vital Signs are stable. Patient is able to ambulate. Patient able to tolerate PO.    Disposition/Plan:  DC Home Additional Verbal discharge instructions given and discussed with patient.  Pt Instructed to f/u with PCP in the next week for evaluation and treatment of symptoms. Return precautions given Pt acknowledges and agrees with plan  Supervising Physician Cathren Laine, MD   Final diagnoses:  TMJ (sprain of temporomandibular joint), initial encounter    New Prescriptions New Prescriptions   No medications on file   I personally performed the services described in this documentation,  which was scribed in my presence. The recorded information has been reviewed and is accurate.    Troy Pili, PA-C 01/10/16 1306    Cathren Laine, MD 01/13/16 [phone]

## 2016-02-15 ENCOUNTER — Encounter (HOSPITAL_COMMUNITY): Payer: Self-pay | Admitting: Emergency Medicine

## 2016-02-15 ENCOUNTER — Emergency Department (HOSPITAL_COMMUNITY)
Admission: EM | Admit: 2016-02-15 | Discharge: 2016-02-15 | Disposition: A | Discharge status: Home / Self Care | Payer: Self-pay | Attending: Emergency Medicine | Admitting: Emergency Medicine

## 2016-02-15 DIAGNOSIS — J069 Acute upper respiratory infection, unspecified: Secondary | ICD-10-CM | POA: Insufficient documentation

## 2016-02-15 DIAGNOSIS — Z79899 Other long term (current) drug therapy: Secondary | ICD-10-CM | POA: Insufficient documentation

## 2016-02-15 MED ORDER — IBUPROFEN 600 MG PO TABS: 600.0000 mg | ORAL_TABLET | Freq: Four times a day (QID) | ORAL | 0 refills | Status: AC | PRN

## 2016-02-15 NOTE — ED Provider Notes (Signed)
[facility]-EMERGENCY DEPT Provider Note   CSN: [id number] Arrival date & time: 02/15/16  2042  By signing my name below, I, Troy Yang, attest that this documentation has been prepared under the direction and in the presence of Troy Piliyler Meshia Rau, PA-C Electronically Signed: Soijett Yang, ED Scribe. 02/15/16. 9:24 PM.  History   Chief Complaint Chief Complaint  Patient presents with  . Neck Pain  . URI    HPI Troy Yang is a 30 y.o. male who presents to the Emergency Department complaining of URI-like symptoms onset 2 days ago. Pt notes that he believes that his symptoms worsened due to standing outside in the cold tonight for a prolonged period of time. He states that he is having associated symptoms of right sided neck pain, mild cough, resolved rhinorrhea, and fatigue. He states that he has not tried any medications for the relief for his symptoms. He denies fever, chills, nausea, vomiting, and any other symptoms.   Pt secondarily complains of left foot pain onset 2 days ago. Pt notes that he has been doing an increased amount of ambulation recently and thinks that that is the cause of his left foot pain. Pt denies any recent injury, fall, or trauma. Pt hasn't tried any medications for the relief of his symptoms. Pt denies gait problem and any other symptoms.   The history is provided by the patient. No language interpreter was used.    History reviewed. No pertinent past medical history.  There are no active problems to display for this patient.   History reviewed. No pertinent surgical history.     Home Medications    Prior to Admission medications   Medication Sig Start Date End Date Taking? Authorizing Provider  diphenhydrAMINE (BENADRYL) 25 MG tablet Take 1 tablet (25 mg total) by mouth every 6 (six) hours as needed. 09/28/15   Cathren LaineKevin Steinl, MD  hydrOXYzine (ATARAX/VISTARIL) 25 MG tablet Take 1 tablet (25 mg total) by mouth every 6 (six) hours. 09/26/15   Hope Orlene OchM Neese, NP    ibuprofen (ADVIL,MOTRIN) 800 MG tablet Take 1 tablet (800 mg total) by mouth 3 (three) times daily. 01/10/16   Troy Piliyler Saquoia Sianez, PA-C  predniSONE (DELTASONE) 10 MG tablet Take 2 tablets (20 mg total) by mouth 2 (two) times daily with a meal. 09/26/15   Hope Orlene OchM Neese, NP  predniSONE (DELTASONE) 20 MG tablet 3 po once a day for 2 days, then 2 po once a day for 3 days, then 1 po once a day for 3 days 09/29/15   Cathren LaineKevin Steinl, MD    Family History History reviewed. No pertinent family history.  Social History Social History  Substance Use Topics  . Smoking status: Unknown If Ever Smoked  . Smokeless tobacco: Never Used     Comment: PT DENIES THAT HE SMOKES.  Marland Kitchen. Alcohol use No     Comment: ocassionally     Allergies   Patient has no known allergies.   Review of Systems Review of Systems  Constitutional: Positive for fatigue. Negative for chills and fever.  HENT: Positive for rhinorrhea (resolved).   Respiratory: Positive for cough.   Gastrointestinal: Negative for nausea and vomiting.  Musculoskeletal: Positive for arthralgias (left foot pain) and neck pain. Negative for gait problem.     Physical Exam Updated Vital Signs BP 114/80 (BP Location: Left Arm)   Pulse 76   Temp 98 F (36.7 C) (Oral)   Resp 16   Ht 5\' 8"  (1.727 m)   Wt 135 lb (  61.2 kg)   SpO2 100%   BMI 20.53 kg/m   Physical Exam  Constitutional: He is oriented to person, place, and time. He appears well-developed and well-nourished. No distress.  HENT:  Head: Normocephalic and atraumatic.  Right Ear: Tympanic membrane, external ear and ear canal normal.  Left Ear: Tympanic membrane, external ear and ear canal normal.  Nose: Nose normal.  Mouth/Throat: Uvula is midline, oropharynx is clear and moist and mucous membranes are normal. No trismus in the jaw. No oropharyngeal exudate, posterior oropharyngeal erythema or tonsillar abscesses.  Able to phonate without difficulty.   Eyes: EOM are normal. Pupils are equal,  round, and reactive to light.  Neck: Normal range of motion and full passive range of motion without pain. Neck supple. No tracheal deviation present.  FROM of neck without difficulty.  Cardiovascular: Normal rate, regular rhythm, S1 normal, S2 normal, normal heart sounds, intact distal pulses and normal pulses.  Exam reveals no gallop and no friction rub.   No murmur heard. Pulmonary/Chest: Effort normal and breath sounds normal. No respiratory distress. He has no decreased breath sounds. He has no wheezes. He has no rhonchi. He has no rales.  Abdominal: Soft. Normal appearance and bowel sounds are normal. He exhibits no distension. There is no tenderness.  Musculoskeletal: Normal range of motion.  Neurological: He is alert and oriented to person, place, and time.  Skin: Skin is warm and dry.  Psychiatric: He has a normal mood and affect. His speech is normal and behavior is normal. Thought content normal.  Nursing note and vitals reviewed.  ED Treatments / Results  DIAGNOSTIC STUDIES: Oxygen Saturation is 100% on RA, nl by my interpretation.    COORDINATION OF CARE: 9:22 PM Discussed treatment plan with pt at bedside which includes ibuprofen Rx and pt agreed to plan.  Procedures Procedures (including critical care time)  Medications Ordered in ED Medications - No data to display   Initial Impression / Assessment and Plan / ED Course  I have reviewed the triage vital signs and the nursing notes.  Clinical Course    I have reviewed the relevant previous healthcare records. I obtained HPI from historian.  ED Course:  Assessment: Pt is a 30yM presents with URI symptoms x 2 days. No fevers. No /V/. No CP/SOB. On exam, pt in NAD. VSS. Afebrile. Lungs CTA, Heart RRR. Abdomen nontender/soft. Posterior oropharynx unremarkable. Bilateral TMs unremarkable. Patients symptoms are consistent with URI, likely viral etiology. Discussed that antibiotics are not indicated for viral infections.  Left foot pain likely strain. No obvious deformities palpable or visualized. Able to ambulate without difficulty. No indication for imaging. No trauma to area. Pt will be discharged with symptomatic treatment.  Verbalizes understanding and is agreeable with plan. Pt is hemodynamically stable & in NAD prior to dc.  Disposition/Plan:  DC home Additional Verbal discharge instructions given and discussed with patient.  Pt Instructed to f/u with PCP in the next week for evaluation and treatment of symptoms. Return precautions given Pt acknowledges and agrees with plan  Supervising Physician Canary Brimhristopher J Tegeler, MD  Final Clinical Impressions(s) / ED Diagnoses   Final diagnoses:  Viral URI    New Prescriptions New Prescriptions   No medications on file   I personally performed the services described in this documentation, which was scribed in my presence. The recorded information has been reviewed and is accurate.    Troy Piliyler Garyson Stelly, PA-C 02/15/16 2128    Canary Brimhristopher J Tegeler, MD 02/16/16 [phone]

## 2016-02-15 NOTE — Discharge Instructions (Signed)
Please read and follow all provided instructions.  Your diagnoses today include:  1. Viral URI     Tests performed today include: Vital signs. See below for your results today.   Medications prescribed:  Take as prescribed   Home care instructions:  Follow any educational materials contained in this packet.  Follow-up instructions: Please follow-up with your primary care provider for further evaluation of symptoms and treatment   Return instructions:  Please return to the Emergency Department if you do not get better, if you get worse, or new symptoms OR  - Fever (temperature greater than 101.66F)  - Bleeding that does not stop with holding pressure to the area    -Severe pain (please note that you may be more sore the day after your accident)  - Chest Pain  - Difficulty breathing  - Severe nausea or vomiting  - Inability to tolerate food and liquids  - Passing out  - Skin becoming red around your wounds  - Change in mental status (confusion or lethargy)  - New numbness or weakness    Please return if you have any other emergent concerns.  Additional Information:  Your vital signs today were: BP 114/80 (BP Location: Left Arm)    Pulse 76    Temp 98 F (36.7 C) (Oral)    Resp 16    Ht 5\' 8"  (1.727 m)    Wt 61.2 kg    SpO2 100%    BMI 20.53 kg/m  If your blood pressure (BP) was elevated above 135/85 this visit, please have this repeated by your doctor within one month. ---------------

## 2016-02-15 NOTE — ED Triage Notes (Signed)
Patient complaining of having a cold, front of left foot is sore, and neck pain. Patient is able to turn neck. Patient is not coughing or have nasal congestion.

## 2020-02-26 ENCOUNTER — Ambulatory Visit (HOSPITAL_COMMUNITY)
Admission: EM | Admit: 2020-02-26 | Discharge: 2020-02-26 | Disposition: A | Discharge status: Home / Self Care | Payer: Self-pay

## 2020-02-26 ENCOUNTER — Other Ambulatory Visit: Payer: Self-pay

## 2020-02-26 ENCOUNTER — Encounter (HOSPITAL_COMMUNITY): Payer: Self-pay | Admitting: Emergency Medicine

## 2020-02-26 NOTE — ED Triage Notes (Signed)
Patient c/o RT sided ABD pain and emesis this morning (0300).  Patient states "I was laying in the bed when I started feeling sick".   Patient endorses that pain got better after emesis.  Patient endorses that pain is worst when laying down.   Patient stated he had 5 episodes of vomiting.   Patient states "this usually occurs after a copious amount of ETOH intake" but patient "didn't drink last night".

## 2020-07-09 ENCOUNTER — Emergency Department (HOSPITAL_COMMUNITY)
Admission: EM | Admit: 2020-07-09 | Discharge: 2020-07-09 | Disposition: A | Discharge status: Home / Self Care | Payer: Medicaid Other | Attending: Emergency Medicine | Admitting: Emergency Medicine

## 2020-07-09 ENCOUNTER — Other Ambulatory Visit: Payer: Self-pay

## 2020-07-09 DIAGNOSIS — R109 Unspecified abdominal pain: Secondary | ICD-10-CM | POA: Insufficient documentation

## 2020-07-09 DIAGNOSIS — Z5321 Procedure and treatment not carried out due to patient leaving prior to being seen by health care provider: Secondary | ICD-10-CM | POA: Insufficient documentation

## 2020-07-09 NOTE — ED Triage Notes (Signed)
Patient bib [facility] c/o abd pain x 2 weeks. Patient stated he thinks he has a virus. Vs wnl. Ems reports patient combative en route , ems says patient was asking for pain meds.

## 2023-10-27 ENCOUNTER — Other Ambulatory Visit: Payer: Self-pay

## 2023-10-27 ENCOUNTER — Encounter (HOSPITAL_COMMUNITY): Payer: Self-pay

## 2023-10-27 ENCOUNTER — Emergency Department (HOSPITAL_COMMUNITY)
Admission: EM | Admit: 2023-10-27 | Discharge: 2023-10-27 | Discharge status: Against Medical Advice | Attending: Emergency Medicine | Admitting: Emergency Medicine

## 2023-10-27 DIAGNOSIS — R1012 Left upper quadrant pain: Secondary | ICD-10-CM | POA: Diagnosis not present

## 2023-10-27 DIAGNOSIS — Z5329 Procedure and treatment not carried out because of patient's decision for other reasons: Secondary | ICD-10-CM | POA: Insufficient documentation

## 2023-10-27 DIAGNOSIS — R109 Unspecified abdominal pain: Secondary | ICD-10-CM | POA: Diagnosis present

## 2023-10-27 DIAGNOSIS — R112 Nausea with vomiting, unspecified: Secondary | ICD-10-CM | POA: Diagnosis not present

## 2023-10-27 LAB — CBC WITH DIFFERENTIAL/PLATELET
Abs Granulocyte: 5.1 K/uL (ref 1.5–6.5)
Abs Immature Granulocytes: 0.02 K/uL (ref 0.00–0.07)
Basophils Absolute: 0 K/uL (ref 0.0–0.1)
Basophils Relative: 0 %
Eosinophils Absolute: 0 K/uL (ref 0.0–0.5)
Eosinophils Relative: 0 %
HCT: 38.7 % — ABNORMAL LOW (ref 39.0–52.0)
Hemoglobin: 13.1 g/dL (ref 13.0–17.0)
Immature Granulocytes: 0 %
Lymphocytes Relative: 10 %
Lymphs Abs: 0.6 K/uL — ABNORMAL LOW (ref 0.7–4.0)
MCH: 30.4 pg (ref 26.0–34.0)
MCHC: 33.9 g/dL (ref 30.0–36.0)
MCV: 89.8 fL (ref 80.0–100.0)
Monocytes Absolute: 0.3 K/uL (ref 0.1–1.0)
Monocytes Relative: 6 %
Neutro Abs: 5.1 K/uL (ref 1.7–7.7)
Neutrophils Relative %: 84 %
Platelets: UNDETERMINED K/uL (ref 150–400)
RBC: 4.31 MIL/uL (ref 4.22–5.81)
RDW: 13.6 % (ref 11.5–15.5)
WBC: 6.1 K/uL (ref 4.0–10.5)
nRBC: 0 % (ref 0.0–0.2)

## 2023-10-27 MED ORDER — ONDANSETRON HCL 4 MG/2ML IJ SOLN
4.0000 mg | Freq: Once | INTRAMUSCULAR | Status: AC
  Administered 2023-10-27: 4 mg via INTRAVENOUS
  Filled 2023-10-27: qty 2

## 2023-10-27 MED ORDER — SODIUM CHLORIDE 0.9 % IV BOLUS
1000.0000 mL | Freq: Once | INTRAVENOUS | Status: AC
  Administered 2023-10-27: 1000 mL via INTRAVENOUS

## 2023-10-27 MED ORDER — MORPHINE SULFATE (PF) 4 MG/ML IV SOLN: 6.0000 mg | Freq: Once | INTRAVENOUS | Status: DC

## 2023-10-27 MED ORDER — MORPHINE SULFATE (PF) 4 MG/ML IV SOLN
4.0000 mg | Freq: Once | INTRAVENOUS | Status: AC
  Administered 2023-10-27: 4 mg via INTRAVENOUS
  Filled 2023-10-27: qty 1

## 2023-10-27 NOTE — ED Notes (Signed)
 Patient pressed Code Blue button x2. Patient educated both times not to press button. Patient states he, feels 100% better and wants to talk to the doctor so he can leave. Patient educated to just wait a second while this Clinical research associate went to get the doctor. Patient then, while the nurse was away with another patient, took his IV out and stated he was going to leave. Patient asked to stay for a minute so the doctor could speak with him, patient refused and then left.

## 2023-10-27 NOTE — ED Provider Notes (Signed)
 I saw and evaluated the patient, reviewed the resident's note and I agree with the findings and plan.  EKG Interpretation Date/Time:  Sunday October 27 2023 10:37:02 EDT Ventricular Rate:  64 PR Interval:  149 QRS Duration:  107 QT Interval:  421 QTC Calculation: 435 R Axis:   106  Text Interpretation: Sinus rhythm Probable right ventricular hypertrophy ST elev, probable normal early repol pattern Confirmed by Dasie Faden ([id number]) on 10/27/2023 11:24:02 AM   Patient's EKG shows normal sinus rhythm with a rate of 64.  Patient here with acute onset of left quadrant abdominal pain has been constant for several hours.  He has had multiple episodes of nonbilious emesis.  On exam, his abdomen is not surgical.  Labs and abdominal CT are pending at this time.  Will IV hydrate and control his pain as well as given antiemetics.   Dasie Faden, MD 10/27/23 1137
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
## 2023-10-27 NOTE — ED Provider Notes (Signed)
 [place] EMERGENCY DEPARTMENT AT [facility] Provider Note   CSN: [id number] Arrival date & time: 10/27/23  1028     Patient presents with: Abdominal Pain   Troy Yang is a 38 y.o. male.   Left sided nonradiating abdominal pain since 0230 this morning, woke him up from sleep Reports NBNB emesis over 13 episodes since pain onset. Denies diarrhea though does report somewhat loose stool this morning. Denies constipation or bloody/dark stools No fevers, CP, SOB, back pain No new foods, does not recall eating anything abnormal the last few days No sick contacts Has not had similar pain before. Has not tried medication for it.  Was able to keep down some fluids but still feeling nauseous Denies significant medical hx, does not take any medications, has not tried any medication for pain  Occasional THC use, most recently a couple of days ago. Denies alcohol, tobacco, other illicit drug use   Abdominal Pain Associated symptoms: nausea and vomiting   Associated symptoms: no chest pain, no constipation, no diarrhea, no dysuria, no fever, no hematuria and no shortness of breath        Prior to Admission medications   Medication Sig Start Date End Date Taking? Authorizing Provider  diphenhydrAMINE  (BENADRYL ) 25 MG tablet Take 1 tablet (25 mg total) by mouth every 6 (six) hours as needed. 09/28/15   Steinl, Kevin, MD  hydrOXYzine  (ATARAX /VISTARIL ) 25 MG tablet Take 1 tablet (25 mg total) by mouth every 6 (six) hours. 09/26/15   Jamelle Lorrayne HERO, NP  ibuprofen  (ADVIL ,MOTRIN ) 600 MG tablet Take 1 tablet (600 mg total) by mouth every 6 (six) hours as needed. 02/15/16   Olympia Gee, PA-C  predniSONE  (DELTASONE ) 10 MG tablet Take 2 tablets (20 mg total) by mouth 2 (two) times daily with a meal. 09/26/15   Jamelle, [place], NP  predniSONE  (DELTASONE ) 20 MG tablet 3 po once a day for 2 days, then 2 po once a day for 3 days, then 1 po once a day for 3 days 09/29/15   Bernard Drivers, MD     Allergies: Patient has no known allergies.    Review of Systems  Constitutional:  Negative for fever.  Respiratory:  Negative for shortness of breath.   Cardiovascular:  Negative for chest pain.  Gastrointestinal:  Positive for abdominal pain, nausea and vomiting. Negative for blood in stool, constipation and diarrhea.  Genitourinary:  Negative for dysuria, flank pain, hematuria and testicular pain.  Neurological:  Negative for dizziness, weakness and headaches.    Updated Vital Signs BP 116/78   Pulse 60   Temp 97.7 F (36.5 C) (Oral)   Resp (!) 21   SpO2 99%   Physical Exam Constitutional:      General: He is not in acute distress. HENT:     Head: Normocephalic and atraumatic.  Cardiovascular:     Rate and Rhythm: Normal rate and regular rhythm.     Heart sounds: No murmur heard. Pulmonary:     Effort: Pulmonary effort is normal. No respiratory distress.     Breath sounds: Normal breath sounds.  Abdominal:     General: Abdomen is flat. Bowel sounds are normal. There is no distension.     Palpations: Abdomen is soft.     Tenderness: There is abdominal tenderness in the left upper quadrant. There is no guarding or rebound.  Neurological:     General: No focal deficit present.     Mental Status: He is alert.     (  all labs ordered are listed, but only abnormal results are displayed) Labs Reviewed  CBC WITH DIFFERENTIAL/PLATELET - Abnormal; Notable for the following components:      Result Value   HCT 38.7 (*)    Lymphs Abs 0.6 (*)    All other components within normal limits  URINALYSIS, ROUTINE W REFLEX MICROSCOPIC  COMPREHENSIVE METABOLIC PANEL WITH GFR  LIPASE, BLOOD    EKG: EKG Interpretation Date/Time:  Sunday October 27 2023 10:37:02 EDT Ventricular Rate:  64 PR Interval:  149 QRS Duration:  107 QT Interval:  421 QTC Calculation: 435 R Axis:   106  Text Interpretation: Sinus rhythm Probable right ventricular hypertrophy ST elev, probable normal  early repol pattern Confirmed by Dasie Faden ([id number]) on 10/27/2023 11:24:02 AM  Radiology: No results found.   Procedures   Medications Ordered in the ED  sodium chloride  0.9 % bolus 1,000 mL (0 mLs Intravenous Stopped 10/27/23 1313)  ondansetron  (ZOFRAN ) injection 4 mg (4 mg Intravenous Given 10/27/23 1205)  morphine  (PF) 4 MG/ML injection 4 mg (4 mg Intravenous Given 10/27/23 1205)                                    Medical Decision Making 38 year old male presenting with left upper quadrant abdominal pain and multiple episodes of NBNB emesis since 0230 today Mild LUQ tenderness on exam, otherwise unremarkable Suspect most likely viral gastroenteritis vs gastritis. Ddx also includes pancreatitis, DKA, hepatobiliary etiology, cannabinoid hyperemeisis. Lower suspicion for appendicitis or obstruction, atypical ACS (EKG NSR without signs of acute ischemia)  Check CBC, CMP, lipase, UA CT abd/pelvis 1L NS bolus given fluid losses with recent emesis Morphine , zofran  for pain and nausea   1:35 PM See RN note: patient left prior to complete evaluation, reported his symptoms were better after receiving bolus, morphine , zofran . CT not performed. CBC unremarkable.   Amount and/or Complexity of Data Reviewed Labs: ordered. Radiology: ordered.  Risk Prescription drug management.       Final diagnoses:  Left upper quadrant abdominal pain    ED Discharge Orders     None          Romelle Booty, MD 10/27/23 1336    Dasie Faden, MD 10/28/23 1326
